# Patient Record
Sex: Male | Born: 1975 | Race: Black or African American | Hispanic: No | Marital: Single | Smoking: Current every day smoker
Health system: Southern US, Community
[De-identification: ages and names within clinical notes are randomized; demographics above are authoritative.]

## PROBLEM LIST (undated history)

## (undated) DIAGNOSIS — I10 Essential (primary) hypertension: Secondary | ICD-10-CM

---

## 2020-04-14 ENCOUNTER — Ambulatory Visit (INDEPENDENT_AMBULATORY_CARE_PROVIDER_SITE_OTHER): Payer: Self-pay

## 2020-04-14 ENCOUNTER — Ambulatory Visit
Admission: EM | Admit: 2020-04-14 | Discharge: 2020-04-14 | Disposition: A | Discharge status: Home / Self Care | Payer: Self-pay

## 2020-04-14 DIAGNOSIS — M79642 Pain in left hand: Secondary | ICD-10-CM

## 2020-04-14 DIAGNOSIS — M25442 Effusion, left hand: Secondary | ICD-10-CM

## 2020-04-14 DIAGNOSIS — M25532 Pain in left wrist: Secondary | ICD-10-CM

## 2020-04-14 HISTORY — DX: Essential (primary) hypertension: I10

## 2020-04-14 MED ORDER — DICLOFENAC SODIUM 50 MG PO TBEC: 50.0000 mg | DELAYED_RELEASE_TABLET | Freq: Two times a day (BID) | ORAL | 0 refills | Status: AC

## 2020-04-14 NOTE — ED Provider Notes (Signed)
[facility]    CSN: [id number] Arrival date & time: 04/14/20  1413      History   Chief Complaint Chief Complaint  Patient presents with  . Hand Pain    HPI Andrew Nunez is a 44 y.o. male.   44 year old male comes in for left hand/wrist pain.  States had been having left wrist pain for the past week without any obvious injuries.  Woke up yesterday with swelling/knot to the left hand with erythema and warmth.  No known injury/trauma, but work requires strenuous activity, heavy lifting with moving boxes, and states could have hit the area.  No significant pain at rest, pain with movement.  States feels decreased strength to the left upper extremity.  And pain occasionally can radiate up to the elbow.  States occasionally will feel numbness/tingling to the fifth finger.  Has not taken anything for the symptoms. RHD     Past Medical History:  Diagnosis Date  . Hypertension     There are no problems to display for this patient.   History reviewed. No pertinent surgical history.     Home Medications    Prior to Admission medications   Medication Sig Start Date End Date Taking? Authorizing Provider  amLODipine (NORVASC) 10 MG tablet Take 10 mg by mouth daily.   Yes [provider]  metoprolol tartrate (LOPRESSOR) 100 MG tablet Take 100 mg by mouth 2 (two) times daily.   Yes [provider]  diclofenac (VOLTAREN) 50 MG EC tablet Take 1 tablet (50 mg total) by mouth 2 (two) times daily. 04/14/20   Belinda Fisher, PA-C    Family History History reviewed. No pertinent family history.  Social History Social History   Tobacco Use  . Smoking status: Current Every Day Smoker  . Smokeless tobacco: Never Used  Substance Use Topics  . Alcohol use: Yes  . Drug use: Yes    Types: Marijuana     Allergies   Penicillins   Review of Systems Review of Systems  Reason unable to perform ROS: See HPI as above.     Physical Exam Triage Vital  Signs ED Triage Vitals  Enc Vitals Group     BP 04/14/20 1433 (!) 152/88     Pulse Rate 04/14/20 1433 73     Resp 04/14/20 1433 18     Temp 04/14/20 1433 98.2 F (36.8 C)     Temp Source 04/14/20 1433 Oral     SpO2 04/14/20 1433 94 %     Weight --      Height --      Head Circumference --      Peak Flow --      Pain Score 04/14/20 1436 7     Pain Loc --      Pain Edu? --      Excl. in GC? --    No data found.  Updated Vital Signs BP (!) 152/88 (BP Location: Left Arm)   Pulse 73   Temp 98.2 F (36.8 C) (Oral)   Resp 18   SpO2 94%   Physical Exam Constitutional:      General: He is not in acute distress.    Appearance: Normal appearance. He is well-developed. He is not toxic-appearing or diaphoretic.  HENT:     Head: Normocephalic and atraumatic.  Eyes:     Conjunctiva/sclera: Conjunctivae normal.     Pupils: Pupils are equal, round, and reactive to light.  Pulmonary:  Effort: Pulmonary effort is normal. No respiratory distress.     Comments: Speaking in full sentences without difficulty Musculoskeletal:     Cervical back: Normal range of motion and neck supple.     Comments: Swelling with erythema to dorsal left hand along 3rd/4th MCP. No contusion. 0.3cm round, mobile lesion felt to the 4th middle MCP.  No fluctuance, induration.  Tenderness to palpation of the area. Full ROM of BUE. Strength slightly decreased due to pain. Sensation intact, radial pulse 2+  Skin:    General: Skin is warm and dry.  Neurological:     Mental Status: He is alert and oriented to person, place, and time.      UC Treatments / Results  Labs (all labs ordered are listed, but only abnormal results are displayed) Labs Reviewed - No data to display  EKG   Radiology DG Hand Complete Left  Result Date: 04/14/2020 CLINICAL DATA:  Left hand pain and swelling. EXAM: LEFT HAND - COMPLETE 3+ VIEW COMPARISON:  None. FINDINGS: There is no evidence of fracture or dislocation. There is no  evidence of arthropathy or other focal bone abnormality. Soft tissues are unremarkable. IMPRESSION: Negative. Electronically Signed   By: Obie Dredge M.D.   On: 04/14/2020 15:43    Procedures Procedures (including critical care time)  Medications Ordered in UC Medications - No data to display  Initial Impression / Assessment and Plan / UC Course  I have reviewed the triage vital signs and the nursing notes.  Pertinent labs & imaging results that were available during my care of the patient were reviewed by me and considered in my medical decision making (see chart for details).    X-ray reviewed by me without obvious fracture/dislocation/bone abnormality. ?  Inflammation causing swelling/erythema.  For now we will treat symptomatically with NSAIDs, ice compress, wrist splint during activity.  Return precautions given.  Patient expresses understanding and agrees plan.  Final Clinical Impressions(s) / UC Diagnoses   Final diagnoses:  Left hand pain  Left wrist pain    ED Prescriptions    Medication Sig Dispense Auth. Provider   diclofenac (VOLTAREN) 50 MG EC tablet Take 1 tablet (50 mg total) by mouth 2 (two) times daily. 20 tablet Belinda Fisher, PA-C     I have reviewed the PDMP during this encounter.   Belinda Fisher, PA-C 04/14/20 1601

## 2020-04-14 NOTE — ED Triage Notes (Signed)
Pt states noticed a "lump" to back of lt hand yesterday with no injury. States has been having lt wrist pain for over a week. States now having pain to lt hand up lt arm.

## 2020-04-14 NOTE — Discharge Instructions (Signed)
Xray reviewed by me does not show obvious fracture, bone growth. If radiologist reads differently, I will let you know. Start diclofenac. Do not take ibuprofen (motrin/advil)/ naproxen (aleve) while on diclofenac. This will help with pain and inflammation. Ice compress to the wrist and hand. Wrist splint during activity. Follow up with sports medicine if symptoms not improving.

## 2020-04-28 ENCOUNTER — Emergency Department (HOSPITAL_COMMUNITY)
Admission: EM | Admit: 2020-04-28 | Discharge: 2020-04-29 | Disposition: A | Discharge status: Home / Self Care | Payer: Self-pay | Attending: Emergency Medicine | Admitting: Emergency Medicine

## 2020-04-28 ENCOUNTER — Encounter (HOSPITAL_COMMUNITY): Payer: Self-pay | Admitting: Emergency Medicine

## 2020-04-28 ENCOUNTER — Other Ambulatory Visit: Payer: Self-pay

## 2020-04-28 DIAGNOSIS — M79641 Pain in right hand: Secondary | ICD-10-CM | POA: Insufficient documentation

## 2020-04-28 DIAGNOSIS — Z5321 Procedure and treatment not carried out due to patient leaving prior to being seen by health care provider: Secondary | ICD-10-CM | POA: Insufficient documentation

## 2020-04-28 NOTE — ED Triage Notes (Addendum)
Pt arrives to ED with lump on right hand that he was seen about on 7/14 at an UC. Pt denies any injury. Had xrays there and was told was a cyst. Here today due to still hurting. Pt has not been on his medications in "a long time". BP elevated in triage

## 2020-04-28 NOTE — ED Notes (Signed)
PT decided to he was going to leave and try to come back in the morning to be seen.

## 2020-07-05 ENCOUNTER — Emergency Department (HOSPITAL_COMMUNITY)
Admission: EM | Admit: 2020-07-05 | Discharge: 2020-07-06 | Disposition: A | Discharge status: Home / Self Care | Payer: No Typology Code available for payment source | Attending: Emergency Medicine | Admitting: Emergency Medicine

## 2020-07-05 ENCOUNTER — Emergency Department (HOSPITAL_COMMUNITY): Payer: No Typology Code available for payment source

## 2020-07-05 DIAGNOSIS — I10 Essential (primary) hypertension: Secondary | ICD-10-CM | POA: Diagnosis not present

## 2020-07-05 DIAGNOSIS — Y92410 Unspecified street and highway as the place of occurrence of the external cause: Secondary | ICD-10-CM | POA: Insufficient documentation

## 2020-07-05 DIAGNOSIS — S301XXA Contusion of abdominal wall, initial encounter: Secondary | ICD-10-CM

## 2020-07-05 DIAGNOSIS — Y9389 Activity, other specified: Secondary | ICD-10-CM | POA: Diagnosis not present

## 2020-07-05 DIAGNOSIS — R52 Pain, unspecified: Secondary | ICD-10-CM

## 2020-07-05 DIAGNOSIS — S3991XA Unspecified injury of abdomen, initial encounter: Secondary | ICD-10-CM | POA: Diagnosis present

## 2020-07-05 DIAGNOSIS — F17200 Nicotine dependence, unspecified, uncomplicated: Secondary | ICD-10-CM | POA: Diagnosis not present

## 2020-07-05 DIAGNOSIS — Z79899 Other long term (current) drug therapy: Secondary | ICD-10-CM | POA: Diagnosis not present

## 2020-07-05 DIAGNOSIS — M549 Dorsalgia, unspecified: Secondary | ICD-10-CM

## 2020-07-05 LAB — COMPREHENSIVE METABOLIC PANEL
ALT: 23 U/L (ref 0–44)
AST: 26 U/L (ref 15–41)
Albumin: 4.1 g/dL (ref 3.5–5.0)
Alkaline Phosphatase: 70 U/L (ref 38–126)
Anion gap: 12 (ref 5–15)
BUN: 14 mg/dL (ref 6–20)
CO2: 23 mmol/L (ref 22–32)
Calcium: 9.3 mg/dL (ref 8.9–10.3)
Chloride: 107 mmol/L (ref 98–111)
Creatinine, Ser: 1.23 mg/dL (ref 0.61–1.24)
GFR calc Af Amer: >60 mL/min (ref >60)
GFR calc non Af Amer: >60 mL/min (ref >60)
Glucose, Bld: 89 mg/dL (ref 70–99)
Potassium: 4.1 mmol/L (ref 3.5–5.1)
Sodium: 142 mmol/L (ref 135–145)
Total Bilirubin: 1.3 mg/dL — ABNORMAL HIGH (ref 0.3–1.2)
Total Protein: 7.7 g/dL (ref 6.5–8.1)

## 2020-07-05 LAB — CBC
HCT: 45.4 % (ref 39.0–52.0)
Hemoglobin: 14.8 g/dL (ref 13.0–17.0)
MCH: 30.2 pg (ref 26.0–34.0)
MCHC: 32.6 g/dL (ref 30.0–36.0)
MCV: 92.7 fL (ref 80.0–100.0)
Platelets: 253 10*3/uL (ref 150–400)
RBC: 4.9 MIL/uL (ref 4.22–5.81)
RDW: 12.3 % (ref 11.5–15.5)
WBC: 8.8 10*3/uL (ref 4.0–10.5)
nRBC: 0 % (ref 0.0–0.2)

## 2020-07-05 LAB — LIPASE, BLOOD: Lipase: 28 U/L (ref 11–51)

## 2020-07-05 MED ORDER — ACETAMINOPHEN 325 MG PO TABS
650.0000 mg | ORAL_TABLET | Freq: Once | ORAL | Status: AC
  Administered 2020-07-06: 650 mg via ORAL
  Filled 2020-07-05: qty 2
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
## 2020-07-06 ENCOUNTER — Emergency Department (HOSPITAL_COMMUNITY): Payer: No Typology Code available for payment source

## 2020-07-06 LAB — URINALYSIS, ROUTINE W REFLEX MICROSCOPIC
Bilirubin Urine: NEGATIVE
Glucose, UA: NEGATIVE mg/dL
Hgb urine dipstick: NEGATIVE
Ketones, ur: 20 mg/dL — AB
Leukocytes,Ua: NEGATIVE
Nitrite: NEGATIVE
Protein, ur: NEGATIVE mg/dL
Specific Gravity, Urine: 1.03 (ref 1.005–1.030)
pH: 5 (ref 5.0–8.0)

## 2020-07-06 MED ORDER — IOHEXOL 300 MG/ML  SOLN
125.0000 mL | Freq: Once | INTRAMUSCULAR | Status: AC | PRN
  Administered 2020-07-06: 125 mL via INTRAVENOUS

## 2020-07-06 MED ORDER — OXYCODONE-ACETAMINOPHEN 5-325 MG PO TABS
1.0000 | ORAL_TABLET | Freq: Once | ORAL | Status: AC
  Administered 2020-07-06: 1 via ORAL
  Filled 2020-07-06: qty 1
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
## 2020-07-06 NOTE — Discharge Instructions (Signed)
Your CT scan bruising to the left side of your abdominal wall but did not show any problem with the inside of your abdomen or the bones.  Please follow-up with your family doctor in the office.  Take 4 over the counter ibuprofen tablets 3 times a day or 2 over-the-counter naproxen tablets twice a day for pain. Also take tylenol 1000mg (2 extra strength) four times a day.

## 2020-07-14 ENCOUNTER — Ambulatory Visit: Payer: Self-pay | Admitting: *Deleted

## 2020-07-14 NOTE — Telephone Encounter (Signed)
Pt reports toothache x 3 days, worsening today. Upper front middle. States 20/10 pain. Started antibiotics, Doxy, yesterday, prescribed by MD who is not local. States facial swelling "And now gum is swollen." Taking IBU "Helps a little for 2 hours." Does not have a dentist or local PCP. Advised UC. Pt states will go to ED "I hope they will extract the tooth."  Reason for Disposition . [1] SEVERE pain (e.g., excruciating, unable to do any normal activities) AND [2] not improved 2 hours after pain medicine  Answer Assessment - Initial Assessment Questions 1. LOCATION: "Which tooth is hurting?"  (e.g., right-side/left-side, upper/lower, front/back)     Upper middle front tooth 2. ONSET: "When did the toothache start?"  (e.g., hours, days)      3 days ago 3. SEVERITY: "How bad is the toothache?"  (Scale 1-10; mild, moderate or severe)   - MILD (1-3): doesn't interfere with chewing    - MODERATE (4-7): interferes with chewing, interferes with normal activities, awakens from sleep     - SEVERE (8-10): unable to eat, unable to do any normal activities, excruciating pain        20/10 4. SWELLING: "Is there any visible swelling of your face?"     Yes 5. OTHER SYMPTOMS: "Do you have any other symptoms?" (e.g., fever)     Gum swelling, Doxy started yesterday.  Protocols used: TOOTHACHE-A-AH
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
## 2021-04-10 ENCOUNTER — Emergency Department (HOSPITAL_COMMUNITY)
Admission: EM | Admit: 2021-04-10 | Discharge: 2021-04-10 | Disposition: A | Discharge status: Home / Self Care | Payer: Medicaid - Out of State | Attending: Emergency Medicine | Admitting: Emergency Medicine

## 2021-04-10 ENCOUNTER — Other Ambulatory Visit: Payer: Self-pay

## 2021-04-10 DIAGNOSIS — S01511A Laceration without foreign body of lip, initial encounter: Secondary | ICD-10-CM | POA: Insufficient documentation

## 2021-04-10 DIAGNOSIS — F17200 Nicotine dependence, unspecified, uncomplicated: Secondary | ICD-10-CM | POA: Insufficient documentation

## 2021-04-10 DIAGNOSIS — Z23 Encounter for immunization: Secondary | ICD-10-CM | POA: Diagnosis not present

## 2021-04-10 DIAGNOSIS — Z79899 Other long term (current) drug therapy: Secondary | ICD-10-CM | POA: Diagnosis not present

## 2021-04-10 DIAGNOSIS — I10 Essential (primary) hypertension: Secondary | ICD-10-CM | POA: Insufficient documentation

## 2021-04-10 DIAGNOSIS — W228XXA Striking against or struck by other objects, initial encounter: Secondary | ICD-10-CM | POA: Diagnosis not present

## 2021-04-10 DIAGNOSIS — S0993XA Unspecified injury of face, initial encounter: Secondary | ICD-10-CM | POA: Diagnosis present

## 2021-04-10 MED ORDER — LIDOCAINE HCL (PF) 1 % IJ SOLN
5.0000 mL | Freq: Once | INTRAMUSCULAR | Status: AC
  Administered 2021-04-10: 5 mL
  Filled 2021-04-10: qty 30

## 2021-04-10 MED ORDER — TETANUS-DIPHTH-ACELL PERTUSSIS 5-2.5-18.5 LF-MCG/0.5 IM SUSY
0.5000 mL | PREFILLED_SYRINGE | Freq: Once | INTRAMUSCULAR | Status: AC
  Administered 2021-04-10: 0.5 mL via INTRAMUSCULAR
  Filled 2021-04-10: qty 0.5
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
## 2021-04-10 NOTE — Discharge Instructions (Addendum)
Clean laceration twice a day with soap and water.  Have sutures removed in 5 to 6 days
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
## 2021-04-16 ENCOUNTER — Emergency Department (HOSPITAL_COMMUNITY)
Admission: EM | Admit: 2021-04-16 | Discharge: 2021-04-16 | Disposition: A | Discharge status: Home / Self Care | Payer: Medicaid - Out of State | Attending: Emergency Medicine | Admitting: Emergency Medicine

## 2021-04-16 ENCOUNTER — Encounter (HOSPITAL_COMMUNITY): Payer: Self-pay

## 2021-04-16 DIAGNOSIS — X58XXXD Exposure to other specified factors, subsequent encounter: Secondary | ICD-10-CM | POA: Diagnosis not present

## 2021-04-16 DIAGNOSIS — I10 Essential (primary) hypertension: Secondary | ICD-10-CM | POA: Insufficient documentation

## 2021-04-16 DIAGNOSIS — F17200 Nicotine dependence, unspecified, uncomplicated: Secondary | ICD-10-CM | POA: Insufficient documentation

## 2021-04-16 DIAGNOSIS — Z79899 Other long term (current) drug therapy: Secondary | ICD-10-CM | POA: Diagnosis not present

## 2021-04-16 DIAGNOSIS — Z4802 Encounter for removal of sutures: Secondary | ICD-10-CM | POA: Diagnosis not present

## 2021-04-16 DIAGNOSIS — S01511D Laceration without foreign body of lip, subsequent encounter: Secondary | ICD-10-CM | POA: Diagnosis present

## 2021-04-16 NOTE — ED Triage Notes (Signed)
Pt arrived via POV, for suture removal.

## 2021-04-16 NOTE — Discharge Instructions (Addendum)
Wash daily with soap and water. Avoid sun exposure, or if you are going to be in the sun, make sure you are using sunscreen to reduce scarring. Return to the emergency room if you develop high fevers, severe worsening pain, pus draining from the area, or any new, worsening, or concerning symptoms.

## 2021-04-16 NOTE — ED Provider Notes (Signed)
[facility]-EMERGENCY DEPT Provider Note   CSN: [id number] Arrival date & time: 04/16/21  1403     History Chief Complaint  Patient presents with   Suture / Staple Removal    Andrew Nunez is a 45 y.o. male presenting for evaluation of suture removal.  Patient states 6 days ago he received several lacerations in his lip.  He has had no pain or bleeding or drainage from the area.  No fevers.  HPI     Past Medical History:  Diagnosis Date   Hypertension     There are no problems to display for this patient.   History reviewed. No pertinent surgical history.     History reviewed. No pertinent family history.  Social History   Tobacco Use   Smoking status: Every Day   Smokeless tobacco: Never  Substance Use Topics   Alcohol use: Yes   Drug use: Yes    Types: Marijuana    Home Medications Prior to Admission medications   Medication Sig Start Date End Date Taking? Authorizing Provider  amLODipine (NORVASC) 10 MG tablet Take 10 mg by mouth daily.    [provider]  diclofenac (VOLTAREN) 50 MG EC tablet Take 1 tablet (50 mg total) by mouth 2 (two) times daily. 04/14/20   Cathie Hoops, Amy V, PA-C  metoprolol tartrate (LOPRESSOR) 100 MG tablet Take 100 mg by mouth 2 (two) times daily.    [provider]    Allergies    Penicillins  Review of Systems   Review of Systems  Constitutional:  Negative for fever.  Skin:  Positive for wound.   Physical Exam Updated Vital Signs BP (!) 168/78 (BP Location: Left Arm)   Pulse 87   Temp 98.2 F (36.8 C)   Resp 18   SpO2 99%   Physical Exam Vitals and nursing note reviewed.  Constitutional:      General: He is not in acute distress.    Appearance: He is well-developed.  HENT:     Head: Normocephalic and atraumatic.     Mouth/Throat:      Comments: Healing laceration of the lower lip extending through the vermilion border.  No purulent drainage or signs of dehiscence. Eyes:      Extraocular Movements: Extraocular movements intact.  Cardiovascular:     Rate and Rhythm: Normal rate.  Pulmonary:     Effort: Pulmonary effort is normal.  Abdominal:     General: There is no distension.  Musculoskeletal:        General: Normal range of motion.     Cervical back: Normal range of motion.  Skin:    General: Skin is warm.     Findings: No rash.  Neurological:     Mental Status: He is alert and oriented to person, place, and time.    ED Results / Procedures / Treatments   Labs (all labs ordered are listed, but only abnormal results are displayed) Labs Reviewed - No data to display  EKG None  Radiology No results found.  Procedures .Suture Removal  Date/Time: 04/16/2021 2:37 PM Performed by: Alveria Apley, PA-C Authorized by: Alveria Apley, PA-C   Consent:    Consent obtained:  Verbal   Consent given by:  Patient   Risks discussed:  Bleeding, pain and wound separation Location:    Location:  Mouth   Mouth location:  Lower exterior lip Procedure details:    Wound appearance:  No signs of infection, good wound healing and nonpurulent  Number of sutures removed:  7 Post-procedure details:    Post-removal:  No dressing applied   Procedure completion:  Tolerated well, no immediate complications   Medications Ordered in ED Medications - No data to display  ED Course  I have reviewed the triage vital signs and the nursing notes.  Pertinent labs & imaging results that were available during my care of the patient were reviewed by me and considered in my medical decision making (see chart for details).    MDM Rules/Calculators/A&P                          Patient presenting for suture removal.  On exam, there are no signs of infection.  Wound is healing well.  7 sutures removed.  Patient tolerated well.  Discussed aftercare instructions.  At this time, patient appears safe for discharge.  Return precautions given.  Patient states he understands  and agrees to plan  Final Clinical Impression(s) / ED Diagnoses Final diagnoses:  Visit for suture removal    Rx / DC Orders ED Discharge Orders     None        Alveria Apley, PA-C 04/16/21 1439    Lorre Nick, MD 04/17/21 1506

## 2021-06-26 DIAGNOSIS — M79671 Pain in right foot: Secondary | ICD-10-CM | POA: Insufficient documentation

## 2021-06-26 DIAGNOSIS — I10 Essential (primary) hypertension: Secondary | ICD-10-CM | POA: Insufficient documentation

## 2021-06-26 DIAGNOSIS — Z79899 Other long term (current) drug therapy: Secondary | ICD-10-CM | POA: Diagnosis not present

## 2021-06-26 DIAGNOSIS — F17200 Nicotine dependence, unspecified, uncomplicated: Secondary | ICD-10-CM | POA: Insufficient documentation

## 2021-06-26 DIAGNOSIS — M10071 Idiopathic gout, right ankle and foot: Secondary | ICD-10-CM | POA: Diagnosis not present

## 2021-06-27 ENCOUNTER — Other Ambulatory Visit: Payer: Self-pay

## 2021-06-27 ENCOUNTER — Emergency Department (HOSPITAL_COMMUNITY)
Admission: EM | Admit: 2021-06-27 | Discharge: 2021-06-27 | Disposition: A | Discharge status: Home / Self Care | Payer: 59 | Attending: Emergency Medicine | Admitting: Emergency Medicine

## 2021-06-27 ENCOUNTER — Encounter (HOSPITAL_COMMUNITY): Payer: Self-pay

## 2021-06-27 DIAGNOSIS — Z79899 Other long term (current) drug therapy: Secondary | ICD-10-CM | POA: Diagnosis not present

## 2021-06-27 DIAGNOSIS — M10071 Idiopathic gout, right ankle and foot: Secondary | ICD-10-CM | POA: Diagnosis not present

## 2021-06-27 DIAGNOSIS — F17200 Nicotine dependence, unspecified, uncomplicated: Secondary | ICD-10-CM | POA: Diagnosis not present

## 2021-06-27 DIAGNOSIS — M79671 Pain in right foot: Secondary | ICD-10-CM | POA: Diagnosis not present

## 2021-06-27 DIAGNOSIS — I10 Essential (primary) hypertension: Secondary | ICD-10-CM | POA: Diagnosis not present

## 2021-06-27 DIAGNOSIS — M109 Gout, unspecified: Secondary | ICD-10-CM

## 2021-06-27 MED ORDER — PREDNISONE 20 MG PO TABS
60.0000 mg | ORAL_TABLET | Freq: Once | ORAL | Status: AC
  Administered 2021-06-27: 60 mg via ORAL
  Filled 2021-06-27: qty 3

## 2021-06-27 MED ORDER — OXYCODONE-ACETAMINOPHEN 5-325 MG PO TABS: 1.0000 | ORAL_TABLET | Freq: Four times a day (QID) | ORAL | 0 refills | Status: AC | PRN

## 2021-06-27 MED ORDER — PREDNISONE 10 MG PO TABS: 20.0000 mg | ORAL_TABLET | Freq: Two times a day (BID) | ORAL | 0 refills | Status: AC

## 2021-06-27 MED ORDER — OXYCODONE-ACETAMINOPHEN 5-325 MG PO TABS
2.0000 | ORAL_TABLET | Freq: Once | ORAL | Status: AC
  Administered 2021-06-27: 2 via ORAL
  Filled 2021-06-27: qty 2

## 2021-06-27 NOTE — Discharge Instructions (Addendum)
Begin taking prednisone as prescribed.  Continue taking oxycodone as previously prescribed as needed for pain.  Follow-up with primary doctor if not improving in the next 2 to 3 days, and return to the ER if symptoms significantly worsen or change.

## 2021-06-27 NOTE — ED Provider Notes (Signed)
[facility]   CSN: [id number] Arrival date & time: 06/26/21  2357     History Chief Complaint  Patient presents with   Foot Swelling    Andrew Nunez is a 45 y.o. male.  Patient is a 45 year old male with history of hypertension.  He presents today for evaluation of pain in his right foot.  This began 2 days ago after wearing crocs.  He denies any specific injury or trauma.  Pain is worse with ambulation and any movement.  He has tried oxycodone at home, but has had little relief.  The history is provided by the patient.      Past Medical History:  Diagnosis Date   Hypertension     There are no problems to display for this patient.   History reviewed. No pertinent surgical history.     History reviewed. No pertinent family history.  Social History   Tobacco Use   Smoking status: Every Day   Smokeless tobacco: Never  Substance Use Topics   Alcohol use: Yes   Drug use: Yes    Types: Marijuana    Home Medications Prior to Admission medications   Medication Sig Start Date End Date Taking? Authorizing Provider  amLODipine (NORVASC) 10 MG tablet Take 10 mg by mouth daily.    [provider]  diclofenac (VOLTAREN) 50 MG EC tablet Take 1 tablet (50 mg total) by mouth 2 (two) times daily. 04/14/20   Cathie Hoops, Amy V, PA-C  metoprolol tartrate (LOPRESSOR) 100 MG tablet Take 100 mg by mouth 2 (two) times daily.    [provider]    Allergies    Penicillins  Review of Systems   Review of Systems  All other systems reviewed and are negative.  Physical Exam Updated Vital Signs BP (!) 211/137 (BP Location: Left Arm)   Pulse 84   Temp 98 F (36.7 C) (Oral)   Resp 18   SpO2 94%   Physical Exam Vitals and nursing note reviewed.  Constitutional:      General: He is not in acute distress.    Appearance: Normal appearance. He is not ill-appearing.  HENT:     Head: Normocephalic and atraumatic.   Pulmonary:     Effort: Pulmonary effort is normal.  Musculoskeletal:     Comments: There is swelling, mild erythema, and warmth overlying the first MTP joint of the right foot.  He has pain with any range of motion.  Skin:    General: Skin is warm and dry.  Neurological:     Mental Status: He is alert.    ED Results / Procedures / Treatments   Labs (all labs ordered are listed, but only abnormal results are displayed) Labs Reviewed - No data to display  EKG None  Radiology No results found.  Procedures Procedures   Medications Ordered in ED Medications  predniSONE (DELTASONE) tablet 60 mg (has no administration in time range)    ED Course  I have reviewed the triage vital signs and the nursing notes.  Pertinent labs & imaging results that were available during my care of the patient were reviewed by me and considered in my medical decision making (see chart for details).    MDM Rules/Calculators/A&P  Patient presenting with pain, redness, and swelling in the first MTP joint.  He has no history of gout, however this presentation is classic for this.  Patient to be treated with prednisone and continued use of his oxycodone, and follow-up  if not improving in the next few days.  Final Clinical Impression(s) / ED Diagnoses Final diagnoses:  None    Rx / DC Orders ED Discharge Orders     None        Geoffery Lyons, MD 06/27/21 [phone]

## 2021-06-27 NOTE — ED Triage Notes (Signed)
Pt has right foot swelling since yesterday after wearing crocs to work. Pt states that he is unable to step down on his right foot. Upon triage pt's blood pressure is elevated, pt states that he did not take his blood pressure medication.

## 2021-07-11 DIAGNOSIS — H52223 Regular astigmatism, bilateral: Secondary | ICD-10-CM | POA: Diagnosis not present

## 2021-08-10 DIAGNOSIS — J209 Acute bronchitis, unspecified: Secondary | ICD-10-CM | POA: Diagnosis not present

## 2022-09-30 DIAGNOSIS — H5213 Myopia, bilateral: Secondary | ICD-10-CM | POA: Diagnosis not present

## 2022-10-24 DIAGNOSIS — H524 Presbyopia: Secondary | ICD-10-CM | POA: Diagnosis not present

## 2022-10-24 DIAGNOSIS — H52223 Regular astigmatism, bilateral: Secondary | ICD-10-CM | POA: Diagnosis not present

## 2022-12-15 DIAGNOSIS — Z79899 Other long term (current) drug therapy: Secondary | ICD-10-CM | POA: Diagnosis not present

## 2022-12-15 DIAGNOSIS — R519 Headache, unspecified: Secondary | ICD-10-CM | POA: Diagnosis not present

## 2022-12-15 DIAGNOSIS — F17210 Nicotine dependence, cigarettes, uncomplicated: Secondary | ICD-10-CM | POA: Diagnosis not present

## 2022-12-15 DIAGNOSIS — I10 Essential (primary) hypertension: Secondary | ICD-10-CM | POA: Diagnosis not present

## 2022-12-15 DIAGNOSIS — R40241 Glasgow coma scale score 13-15, unspecified time: Secondary | ICD-10-CM | POA: Diagnosis not present

## 2022-12-15 DIAGNOSIS — Z880 Allergy status to penicillin: Secondary | ICD-10-CM | POA: Diagnosis not present

## 2023-01-05 DIAGNOSIS — Z6841 Body Mass Index (BMI) 40.0 and over, adult: Secondary | ICD-10-CM | POA: Diagnosis not present

## 2023-01-05 DIAGNOSIS — E782 Mixed hyperlipidemia: Secondary | ICD-10-CM | POA: Diagnosis not present

## 2023-01-05 DIAGNOSIS — I10 Essential (primary) hypertension: Secondary | ICD-10-CM | POA: Diagnosis not present

## 2023-02-06 DIAGNOSIS — M545 Low back pain, unspecified: Secondary | ICD-10-CM | POA: Diagnosis not present

## 2023-02-06 DIAGNOSIS — I10 Essential (primary) hypertension: Secondary | ICD-10-CM | POA: Diagnosis not present

## 2023-02-16 DIAGNOSIS — Z113 Encounter for screening for infections with a predominantly sexual mode of transmission: Secondary | ICD-10-CM | POA: Diagnosis not present

## 2023-02-16 DIAGNOSIS — Z7251 High risk heterosexual behavior: Secondary | ICD-10-CM | POA: Diagnosis not present

## 2023-02-16 DIAGNOSIS — I10 Essential (primary) hypertension: Secondary | ICD-10-CM | POA: Diagnosis not present

## 2023-02-16 DIAGNOSIS — M545 Low back pain, unspecified: Secondary | ICD-10-CM | POA: Diagnosis not present

## 2023-04-06 DIAGNOSIS — I10 Essential (primary) hypertension: Secondary | ICD-10-CM | POA: Diagnosis not present

## 2023-04-06 DIAGNOSIS — E785 Hyperlipidemia, unspecified: Secondary | ICD-10-CM | POA: Diagnosis not present

## 2023-04-06 DIAGNOSIS — R0683 Snoring: Secondary | ICD-10-CM | POA: Diagnosis not present

## 2023-04-13 DIAGNOSIS — I10 Essential (primary) hypertension: Secondary | ICD-10-CM | POA: Diagnosis not present

## 2023-04-13 DIAGNOSIS — R0683 Snoring: Secondary | ICD-10-CM | POA: Diagnosis not present

## 2023-04-13 DIAGNOSIS — G4700 Insomnia, unspecified: Secondary | ICD-10-CM | POA: Diagnosis not present

## 2023-04-13 DIAGNOSIS — G478 Other sleep disorders: Secondary | ICD-10-CM | POA: Diagnosis not present

## 2023-04-13 DIAGNOSIS — G479 Sleep disorder, unspecified: Secondary | ICD-10-CM | POA: Diagnosis not present

## 2023-04-13 DIAGNOSIS — G4719 Other hypersomnia: Secondary | ICD-10-CM | POA: Diagnosis not present

## 2023-04-13 DIAGNOSIS — R0681 Apnea, not elsewhere classified: Secondary | ICD-10-CM | POA: Diagnosis not present

## 2023-04-13 DIAGNOSIS — Z6841 Body Mass Index (BMI) 40.0 and over, adult: Secondary | ICD-10-CM | POA: Diagnosis not present

## 2023-04-13 DIAGNOSIS — R5383 Other fatigue: Secondary | ICD-10-CM | POA: Diagnosis not present

## 2023-04-27 DIAGNOSIS — Z6841 Body Mass Index (BMI) 40.0 and over, adult: Secondary | ICD-10-CM | POA: Diagnosis not present

## 2023-04-27 DIAGNOSIS — G478 Other sleep disorders: Secondary | ICD-10-CM | POA: Diagnosis not present

## 2023-04-27 DIAGNOSIS — G479 Sleep disorder, unspecified: Secondary | ICD-10-CM | POA: Diagnosis not present

## 2023-04-27 DIAGNOSIS — R5383 Other fatigue: Secondary | ICD-10-CM | POA: Diagnosis not present

## 2023-04-27 DIAGNOSIS — R0683 Snoring: Secondary | ICD-10-CM | POA: Diagnosis not present

## 2023-04-27 DIAGNOSIS — G4719 Other hypersomnia: Secondary | ICD-10-CM | POA: Diagnosis not present

## 2023-04-27 DIAGNOSIS — G4700 Insomnia, unspecified: Secondary | ICD-10-CM | POA: Diagnosis not present

## 2023-04-27 DIAGNOSIS — R0681 Apnea, not elsewhere classified: Secondary | ICD-10-CM | POA: Diagnosis not present

## 2023-04-27 DIAGNOSIS — I10 Essential (primary) hypertension: Secondary | ICD-10-CM | POA: Diagnosis not present

## 2023-04-28 DIAGNOSIS — G4733 Obstructive sleep apnea (adult) (pediatric): Secondary | ICD-10-CM | POA: Diagnosis not present

## 2023-05-17 DIAGNOSIS — G4733 Obstructive sleep apnea (adult) (pediatric): Secondary | ICD-10-CM | POA: Diagnosis not present

## 2023-05-17 DIAGNOSIS — E782 Mixed hyperlipidemia: Secondary | ICD-10-CM | POA: Diagnosis not present

## 2023-05-17 DIAGNOSIS — I10 Essential (primary) hypertension: Secondary | ICD-10-CM | POA: Diagnosis not present

## 2023-05-17 DIAGNOSIS — R7989 Other specified abnormal findings of blood chemistry: Secondary | ICD-10-CM | POA: Diagnosis not present

## 2023-05-17 DIAGNOSIS — F17200 Nicotine dependence, unspecified, uncomplicated: Secondary | ICD-10-CM | POA: Diagnosis not present

## 2023-05-18 DIAGNOSIS — G4733 Obstructive sleep apnea (adult) (pediatric): Secondary | ICD-10-CM | POA: Diagnosis not present

## 2023-12-24 DIAGNOSIS — Z131 Encounter for screening for diabetes mellitus: Secondary | ICD-10-CM | POA: Diagnosis not present

## 2023-12-24 DIAGNOSIS — Z1211 Encounter for screening for malignant neoplasm of colon: Secondary | ICD-10-CM | POA: Diagnosis not present

## 2023-12-24 DIAGNOSIS — I10 Essential (primary) hypertension: Secondary | ICD-10-CM | POA: Diagnosis not present

## 2023-12-24 DIAGNOSIS — E559 Vitamin D deficiency, unspecified: Secondary | ICD-10-CM | POA: Diagnosis not present

## 2023-12-24 DIAGNOSIS — Z1331 Encounter for screening for depression: Secondary | ICD-10-CM | POA: Diagnosis not present

## 2023-12-24 DIAGNOSIS — E782 Mixed hyperlipidemia: Secondary | ICD-10-CM | POA: Diagnosis not present

## 2024-01-08 DIAGNOSIS — I10 Essential (primary) hypertension: Secondary | ICD-10-CM | POA: Diagnosis not present

## 2024-01-08 DIAGNOSIS — E782 Mixed hyperlipidemia: Secondary | ICD-10-CM | POA: Diagnosis not present

## 2024-02-06 DIAGNOSIS — J020 Streptococcal pharyngitis: Secondary | ICD-10-CM | POA: Diagnosis not present

## 2024-02-06 DIAGNOSIS — M791 Myalgia, unspecified site: Secondary | ICD-10-CM | POA: Diagnosis not present

## 2024-02-06 DIAGNOSIS — Z79899 Other long term (current) drug therapy: Secondary | ICD-10-CM | POA: Diagnosis not present

## 2024-02-06 DIAGNOSIS — R5383 Other fatigue: Secondary | ICD-10-CM | POA: Diagnosis not present

## 2024-02-06 DIAGNOSIS — I10 Essential (primary) hypertension: Secondary | ICD-10-CM | POA: Diagnosis not present

## 2024-02-06 DIAGNOSIS — F17200 Nicotine dependence, unspecified, uncomplicated: Secondary | ICD-10-CM | POA: Diagnosis not present
